# Patient Record
Sex: Male | Born: 1980 | Race: White | Hispanic: No | Marital: Married | Smoking: Former smoker
Health system: Southern US, Community
[De-identification: ages and names within clinical notes are randomized; demographics above are authoritative.]

## PROBLEM LIST (undated history)

## (undated) DIAGNOSIS — F909 Attention-deficit hyperactivity disorder, unspecified type: Secondary | ICD-10-CM

## (undated) DIAGNOSIS — F419 Anxiety disorder, unspecified: Secondary | ICD-10-CM

## (undated) DIAGNOSIS — K5190 Ulcerative colitis, unspecified, without complications: Secondary | ICD-10-CM

## (undated) DIAGNOSIS — F329 Major depressive disorder, single episode, unspecified: Secondary | ICD-10-CM

## (undated) DIAGNOSIS — F32A Depression, unspecified: Secondary | ICD-10-CM

## (undated) DIAGNOSIS — S0291XA Unspecified fracture of skull, initial encounter for closed fracture: Secondary | ICD-10-CM

## (undated) HISTORY — PX: BRAIN SURGERY: SHX531

---

## 2004-08-23 ENCOUNTER — Emergency Department (HOSPITAL_COMMUNITY): Admission: EM | Admit: 2004-08-23 | Discharge: 2004-08-23 | Payer: Self-pay | Admitting: Emergency Medicine

## 2014-12-19 ENCOUNTER — Emergency Department (HOSPITAL_BASED_OUTPATIENT_CLINIC_OR_DEPARTMENT_OTHER)
Admission: EM | Admit: 2014-12-19 | Discharge: 2014-12-19 | Disposition: A | Discharge status: Home / Self Care | Payer: BLUE CROSS/BLUE SHIELD | Attending: Emergency Medicine | Admitting: Emergency Medicine

## 2014-12-19 ENCOUNTER — Encounter (HOSPITAL_BASED_OUTPATIENT_CLINIC_OR_DEPARTMENT_OTHER): Payer: Self-pay

## 2014-12-19 DIAGNOSIS — Z720 Tobacco use: Secondary | ICD-10-CM | POA: Diagnosis not present

## 2014-12-19 DIAGNOSIS — F329 Major depressive disorder, single episode, unspecified: Secondary | ICD-10-CM | POA: Insufficient documentation

## 2014-12-19 DIAGNOSIS — F32A Depression, unspecified: Secondary | ICD-10-CM

## 2014-12-19 DIAGNOSIS — Z79899 Other long term (current) drug therapy: Secondary | ICD-10-CM | POA: Insufficient documentation

## 2014-12-19 DIAGNOSIS — F419 Anxiety disorder, unspecified: Secondary | ICD-10-CM | POA: Diagnosis present

## 2014-12-19 DIAGNOSIS — Z8781 Personal history of (healed) traumatic fracture: Secondary | ICD-10-CM | POA: Insufficient documentation

## 2014-12-19 DIAGNOSIS — F1310 Sedative, hypnotic or anxiolytic abuse, uncomplicated: Secondary | ICD-10-CM | POA: Diagnosis not present

## 2014-12-19 HISTORY — DX: Unspecified fracture of skull, initial encounter for closed fracture: S02.91XA

## 2014-12-19 HISTORY — DX: Anxiety disorder, unspecified: F41.9

## 2014-12-19 HISTORY — DX: Depression, unspecified: F32.A

## 2014-12-19 HISTORY — DX: Attention-deficit hyperactivity disorder, unspecified type: F90.9

## 2014-12-19 HISTORY — DX: Major depressive disorder, single episode, unspecified: F32.9

## 2014-12-19 LAB — URINALYSIS, ROUTINE W REFLEX MICROSCOPIC
Glucose, UA: NEGATIVE mg/dL
Hgb urine dipstick: NEGATIVE
KETONES UR: 15 mg/dL — AB
Leukocytes, UA: NEGATIVE
Nitrite: NEGATIVE
PH: 5.5 (ref 5.0–8.0)
PROTEIN: NEGATIVE mg/dL
Specific Gravity, Urine: 1.033 — ABNORMAL HIGH (ref 1.005–1.030)
Urobilinogen, UA: 0.2 mg/dL (ref 0.0–1.0)

## 2014-12-19 LAB — RAPID URINE DRUG SCREEN, HOSP PERFORMED
Amphetamines: NOT DETECTED
Barbiturates: NOT DETECTED
Benzodiazepines: POSITIVE — AB
Cocaine: NOT DETECTED
Opiates: NOT DETECTED
TETRAHYDROCANNABINOL: NOT DETECTED

## 2014-12-19 MED ORDER — ESCITALOPRAM OXALATE 10 MG PO TABS: 10.0000 mg | ORAL_TABLET | Freq: Every day | ORAL | Status: DC

## 2014-12-19 NOTE — Discharge Instructions (Signed)
Depression Depression refers to feeling sad, low, down in the dumps, blue, gloomy, or empty. In general, there are two kinds of depression: 1. Normal sadness or normal grief. This kind of depression is one that we all feel from time to time after upsetting life experiences, such as the loss of a job or the ending of a relationship. This kind of depression is considered normal, is short lived, and resolves within a few days to 2 weeks. Depression experienced after the loss of a loved one (bereavement) often lasts longer than 2 weeks but normally gets better with time. 2. Clinical depression. This kind of depression lasts longer than normal sadness or normal grief or interferes with your ability to function at home, at work, and in school. It also interferes with your personal relationships. It affects almost every aspect of your life. Clinical depression is an illness. Symptoms of depression can also be caused by conditions other than those mentioned above, such as:  Physical illness. Some physical illnesses, including underactive thyroid gland (hypothyroidism), severe anemia, specific types of cancer, diabetes, uncontrolled seizures, heart and lung problems, strokes, and chronic pain are commonly associated with symptoms of depression.  Side effects of some prescription medicine. In some people, certain types of medicine can cause symptoms of depression.  Substance abuse. Abuse of alcohol and illicit drugs can cause symptoms of depression. SYMPTOMS Symptoms of normal sadness and normal grief include the following:  Feeling sad or crying for short periods of time.  Not caring about anything (apathy).  Difficulty sleeping or sleeping too much.  No longer able to enjoy the things you used to enjoy.  Desire to be by oneself all the time (social isolation).  Lack of energy or motivation.  Difficulty concentrating or remembering.  Change in appetite or weight.  Restlessness or  agitation. Symptoms of clinical depression include the same symptoms of normal sadness or normal grief and also the following symptoms:  Feeling sad or crying all the time.  Feelings of guilt or worthlessness.  Feelings of hopelessness or helplessness.  Thoughts of suicide or the desire to harm yourself (suicidal ideation).  Loss of touch with reality (psychotic symptoms). Seeing or hearing things that are not real (hallucinations) or having false beliefs about your life or the people around you (delusions and paranoia). DIAGNOSIS  The diagnosis of clinical depression is usually based on how bad the symptoms are and how long they have lasted. Your health care provider will also ask you questions about your medical history and substance use to find out if physical illness, use of prescription medicine, or substance abuse is causing your depression. Your health care provider may also order blood tests. TREATMENT  Often, normal sadness and normal grief do not require treatment. However, sometimes antidepressant medicine is given for bereavement to ease the depressive symptoms until they resolve. The treatment for clinical depression depends on how bad the symptoms are but often includes antidepressant medicine, counseling with a mental health professional, or both. Your health care provider will help to determine what treatment is best for you. Depression caused by physical illness usually goes away with appropriate medical treatment of the illness. If prescription medicine is causing depression, talk with your health care provider about stopping the medicine, decreasing the dose, or changing to another medicine. Depression caused by the abuse of alcohol or illicit drugs goes away when you stop using these substances. Some adults need professional help in order to stop drinking or using drugs. [facility]  CARE IF:  You have thoughts about hurting yourself or others.  You lose touch  with reality (have psychotic symptoms).  You are taking medicine for depression and have a serious side effect. FOR MORE INFORMATION  National Alliance on Mental Illness: www.nami.[employer] of Mental Health: https://carter.com/ Document Released: 09/30/2000 Document Revised: 02/17/2014 Document Reviewed: 01/02/2012 [facility] Patient Information 2015 [place], [place]. This information is not intended to replace advice given to you by your health care provider. Make sure you discuss any questions you have with your health care provider. Emergency Department Resource Guide 1) Find a Doctor and Pay Out of Pocket Although you won't have to find out who is covered by your insurance plan, it is a good idea to ask around and get recommendations. You will then need to call the office and see if the doctor you have chosen will accept you as a new patient and what types of options they offer for patients who are self-pay. Some doctors offer discounts or will set up payment plans for their patients who do not have insurance, but you will need to ask so you aren't surprised when you get to your appointment.  2) Contact Your Local Health Department Not all health departments have doctors that can see patients for sick visits, but many do, so it is worth a call to see if yours does. If you don't know where your local health department is, you can check in your phone book. The CDC also has a tool to help you locate your state's health department, and many state websites also have listings of all of their local health departments.  3) Find a [facility] If your illness is not likely to be very severe or complicated, you may want to try a walk in clinic. These are popping up all over the country in pharmacies, drugstores, and shopping centers. They're usually staffed by nurse practitioners or physician assistants that have been trained to treat common illnesses and complaints. They're usually fairly  quick and inexpensive. However, if you have serious medical issues or chronic medical problems, these are probably not your best option.  No Primary Care Doctor: - Call Health Connect at  [phone] - they can help you locate a primary care doctor that  accepts your insurance, provides certain services, etc. - Physician Referral Service- [phone]  Chronic Pain Problems: Organization         Address     Phone             Notes  [facility]  [phone] Patients need to be referred by their primary care doctor.   Medication Assistance: Organization         Address     Phone             Notes  [facility] Medication [facility] [address] [place], [place] [address] [phone] --Must be a resident of [facility] -- Must have NO insurance coverage whatsoever (no Medicaid/ Medicare, etc.) -- The pt. MUST have a primary care doctor that directs their care regularly and follows them in the community   MedAssist  [phone]   [employer]  [phone]    Agencies that provide inexpensive medical care: Organization         Address     Phone             Notes  [place]  [phone]  [facility]    [phone]   [facility] [place], [place] [address] [phone]   [place]. [address], [place] [phone]   Planned Parenthood    [phone]   [facility]    [phone]   [place] and [place] [address], [place] Phone:  [phone], Fax:  [phone] Hours of Operation:  9 am - 6 pm, M-F.  Also accepts Medicaid/Medicare and self-pay.  [facility] for [place], [address], [place] Phone: [phone], Fax: [phone]. Hours of Operation:  8:30 am - 5:30 pm, M-F.  Also accepts Medicaid and self-pay.  [facility] [place] [address] [place] Phone: [phone]   [place], [place], [place] [phone], Ext. [phone] Mondays & Thursdays: 7-9 AM.  First 15 patients are seen on a first come, first serve basis.   [facility] [place] [address], [place] [address] [phone] Accepts Medicaid   [place] Providers:  Organization         Address     Phone             Notes  [facility] [address], [place] [phone] Also accepts self-pay patients.  [facility] [address], [place]  [phone]   [place], [address], [place] [phone]   [facility] [address], [place] [phone]   Lucianne Lei [address], [place]   [phone] Only accepts [place] Access [place] patients after they have their name applied to their card.   Self-Pay (no insurance) in [facility]:  Organization         Address     Phone             Notes  Sickle Cell Patients, [facility] [place] [phone]   [facility] [place] [phone]   [facility] [place]  [place], [place], [place] [phone]   [facility]/Dr. Osei-Bonsu  [address] [place] or [address], [place] [phone] Phone number for both [place] and [place] locations is the same.  Urgent Medical and [facility] [address] [place] [phone]   [facility] [address] [place] or [address] [phone]   [facility] [address], [place] [phone], phone; [phone], fax Sees patients 1st and 3rd Saturday of every month.  Must not qualify for public or private  insurance (i.e. Medicaid, Medicare, [place] Health Choice, Veterans' Benefits)  Household income should be no more than 200% of the poverty level The clinic cannot treat you if you are pregnant or think you are pregnant  Sexually transmitted diseases are not treated at the clinic.    Dental Care:  Organization         Address     Phone             Notes  [facility] [address] [place], [place] (  [phone] Accepts children up to age 16 who are enrolled in Medicaid or [place]; pregnant women with a Medicaid card; and children who have applied for Medicaid or [place] Health Choice, but were declined, whose parents can pay a reduced fee at time of service.  [facility] Department of [facility]  [address], [place] [phone] Accepts children up to age 73 who are enrolled in [place] or [place]; pregnant women with a Medicaid card; and children who have applied for Medicaid or [place] Health Choice, but were declined, whose parents can pay a reduced fee at time of service.  [facility] Adult [facility] Access PROGRAM  [place] [phone] Patients are seen by appointment only. Walk-ins are not accepted. Lane will see patients 27 years of age and older. Monday - Tuesday (8am-5pm) Most Wednesdays (8:30-5pm) $30 per visit, cash only  [facility] Adult Dental Access PROGRAM  [address], [facility] [phone] Patients are seen by appointment only. Walk-ins are not accepted. [place] will see patients 27 years of age and older. One Wednesday Evening (Monthly: Volunteer Based).  $30 per visit, cash only  [place]  [phone] for adults; Children under age 72, call Graduate Pediatric Dentistry at [phone]. Children aged 78-14, please call [phone] to request a pediatric application.  Dental services are provided in all areas of  dental care including fillings, crowns and bridges, complete and partial dentures, implants, gum treatment, root canals, and extractions. Preventive care is also provided. Treatment is provided to both adults and children. Patients are selected via a lottery and there is often a waiting list.   [facility] [address] [place]  [phone] [email or web address]   [facility] [address] [place], [place] [phone], Ext. [phone] Second and Fourth Thursday of each month, opens at 6:30 AM; Clinic ends at 9 AM.  Patients are seen on a first-come first-served basis, and a limited number are seen during each clinic.   [facility]  [address] Hillard Danker [place], [place] [phone]   Eligibility Requirements You must have lived in [place], [place], or [place] counties for at least the last three months.   You cannot be eligible for state or federal sponsored [employer], including [employer], [place], or [employer].   You generally cannot be eligible for healthcare insurance through your employer.    How to apply: Eligibility screenings are held every Tuesday and Wednesday afternoon from 1:00 pm until 4:00 pm. You do not need an appointment for the interview!  [facility] [address] [place], [place]   [place]  [place] Department  [place]  [phone]    Behavioral Health Resources in the Community: Intensive Outpatient Programs Organization         Address     Phone             Notes  [place] [facility]. [address] [place], [place] [phone]   [facility] [address], [place], [place]   ADS: Alcohol & Drug Svcs [address], [place], [place]   [place] [address],  [place], [place] or [phone]     Substance Abuse Resources Organization  Address     Phone             Notes  Alcohol and Drug Services  [phone]   [place]  [phone]   [facility] [place]  [phone]   Chinita Pester  [phone]   Residential & Outpatient Substance Abuse Program  [phone]   Psychological Services Organization         Address     Phone             Notes  [facility] [place]  [place]  [phone]   [place] [address] [place] or [phone]    Mobile Crisis Teams Organization         Address     Phone             Notes  [facility], Mobile Crisis Care Unit  [phone]   [facility]ive Psychotherapeutic Services  [address] [place], [place]   [place] [address] [place] [phone]    Self-Help/Support Groups Organization         Address     Phone             Notes  [place]. of [place] - variety of support groups  [place] Call for more information  Narcotics Anonymous (NA), [facility]aring [facility] [address], [employer] [place]  2 meetings at this location   Special educational needs teacher         Address     Phone             Notes  [facility]AP Residential Treatment [place],    [place]  [phone]   [facility]  [address], [place] [address], [place], [place]   [place], [place] [phone] Admissions: 8am-3pm M-F  Incentives Substance [place] [address],    [place], [place] [phone]   [facility] [address] [place], [place], [place]   [facility] [address],  [place], [place]   Insight Programs - Intensive Outpatient [address] Kristeen [address], [place], [place]   [facility] ([place].) [place].,  [place], [place] [phone] or [phone]   [facility]idential Treatment Services (RTS) [address], [place], [place] Accepts Medicaid  [facility] [place] [address],  [place] [phone] Substance Abuse/Addiction Treatment   [facility] Organization         Address     Phone             Notes  [facility]  [phone]   Domenic Schwab, PhD [address] Arlis [place], [place]   [phone] or [phone]   [place], [place] [phone]   [place] [address] [place], [place] [phone] Insurance/Medicaid/sponsorship through [facility] and Families [address], [address] [place]                                    [place], [place] [phone] [place] [address] [place], [place] [phone]    Dr. Adele Schilder  [phone]   [facility] [place]  [facility] Dept. 1) [address], [place] 2) [place] 3)  [place] [address], [place] [phone]  [phone]   [place] [phone] or [phone] (After Hours)

## 2014-12-19 NOTE — ED Notes (Addendum)
Mother with pt, talking over pt intermittently.  States pt has been under a lot of stress recently and had an episode at work recently in which he had st memory loss, seen by osh in [place] had both medical and psych eval and sent home.  Mother upset over this.  Pt states he feels fine now other than anxiety and depression.  States he works a lot, home life is "in a wreck", has had insomnia.  Denies si/hi. Denied pain initially but when asked pain score pt states he is having "chest pressure".  Pt reports has been to several people for anxiety and depression and has not been able to get treated so has "given up on trying to get help".  Denies any abuse, states he is just "really stressed and it makes daily life function impossible".

## 2014-12-19 NOTE — ED Provider Notes (Signed)
CSN: [id number]     Arrival date & time 12/19/14  1213 History   First MD Initiated Contact with Patient 12/19/14 1427     Chief Complaint  Patient presents with  . Anxiety    HPI Patient presents to the emergency room for help with depression. Patient does have history of depression. She is healthy symptoms off and on throughout his life. Usually he is able to manage it.  Recently he has been having more issues with stress at work. His long-time girlfriend also left him. That work in [place] when he had an episode of global amnesia. According to the patient's mother and the patient was evaluated at [facility]. They eventually transferred him to a psychiatric facility. The mother states the patient was released.  He was not given any medications to take however. His mother gave him one of his Xanax which helped a little bit.  Patient's mother drove the patient back to [place]. She is trying to get him to see his psychiatrist in [place]. Patient denies any acute issues with suicidal or homicidal ideation. He would like to get started on some type of medication so he can get back to work. Past Medical History  Diagnosis Date  . Anxiety   . Depression   . ADHD (attention deficit hyperactivity disorder)   . Depressed skull fracture    Past Surgical History  Procedure Laterality Date  . Brain surgery     No family history on file. History  Substance Use Topics  . Smoking status: Current Every Day Smoker -- 1.00 packs/day    Types: Cigarettes  . Smokeless tobacco: Not on file  . Alcohol Use: No    Review of Systems  All other systems reviewed and are negative.     Allergies  Review of patient's allergies indicates no known allergies.  Home Medications   Prior to Admission medications   Medication Sig Start Date End Date Taking? Authorizing Provider  escitalopram (LEXAPRO) 10 MG tablet Take 1 tablet (10 mg total) by mouth daily. 12/19/14   Dorie Rank, MD   BP  125/86 mmHg  Pulse 111  Temp(Src) 98.6 F (37 C) (Oral)  Resp 18  Ht 5' 10"  (1.778 m)  Wt 155 lb (70.308 kg)  BMI 22.24 kg/m2  SpO2 99% Physical Exam  Constitutional: He appears well-developed and well-nourished. No distress.  HENT:  Head: Normocephalic and atraumatic.  Right Ear: External ear normal.  Left Ear: External ear normal.  Eyes: Conjunctivae are normal. Right eye exhibits no discharge. Left eye exhibits no discharge. No scleral icterus.  Neck: Neck supple. No tracheal deviation present.  Cardiovascular: Normal rate.   Pulmonary/Chest: Effort normal. No stridor. No respiratory distress.  Musculoskeletal: He exhibits no edema.  Neurological: He is alert. Cranial nerve deficit: no gross deficits.  Skin: Skin is warm and dry. No rash noted.  Psychiatric: His speech is not delayed, not tangential and not slurred. He is not slowed and not withdrawn. Cognition and memory are not impaired. He exhibits a depressed mood. He expresses no homicidal and no suicidal ideation. He is communicative.  Nursing note and vitals reviewed.   ED Course  Procedures (including critical care time) Labs Review Labs Reviewed  URINALYSIS, ROUTINE W REFLEX MICROSCOPIC - Abnormal; Notable for the following:    Color, Urine AMBER (*)    Specific Gravity, Urine 1.033 (*)    Bilirubin Urine SMALL (*)    Ketones, ur 15 (*)    All other components  within normal limits  URINE RAPID DRUG SCREEN (HOSP PERFORMED) - Abnormal; Notable for the following:    Benzodiazepines POSITIVE (*)    All other components within normal limits    Imaging Review No results found.   EKG Interpretation   Date/Time:  Friday December 19 2014 12:32:48 EST Ventricular Rate:  92 PR Interval:  128 QRS Duration: 94 QT Interval:  356 QTC Calculation: 440 R Axis:   93 Text Interpretation:  Normal sinus rhythm with sinus arrhythmia Rightward  axis Incomplete right bundle branch block Borderline ECG No old tracing to   compare Confirmed by Kamden Reber  MD-J, Neyla Gauntt ([id number]) on 12/19/2014 12:51:29 PM      MDM   Final diagnoses:  Depression    The patient's symptoms are concerning for depression. I think it is reasonable to start him on a low dose of Lexapro. Mother requested benzodiazepines. I discussed my concerns with this type of medication and I will give him a referral to a psychiatrist as well.    Dorie Rank, MD 12/19/14 [phone]

## 2017-08-09 ENCOUNTER — Encounter: Payer: Self-pay | Admitting: *Deleted

## 2017-08-09 ENCOUNTER — Emergency Department
Admission: EM | Admit: 2017-08-09 | Discharge: 2017-08-09 | Disposition: A | Discharge status: Home / Self Care | Payer: Self-pay | Source: Home / Self Care | Attending: Family Medicine | Admitting: Family Medicine

## 2017-08-09 ENCOUNTER — Emergency Department (INDEPENDENT_AMBULATORY_CARE_PROVIDER_SITE_OTHER): Payer: Self-pay

## 2017-08-09 DIAGNOSIS — S63613A Unspecified sprain of left middle finger, initial encounter: Secondary | ICD-10-CM

## 2017-08-09 DIAGNOSIS — W3189XA Contact with other specified machinery, initial encounter: Secondary | ICD-10-CM

## 2017-08-09 DIAGNOSIS — S61213A Laceration without foreign body of left middle finger without damage to nail, initial encounter: Secondary | ICD-10-CM

## 2017-08-09 NOTE — Discharge Instructions (Signed)
°  You should keep today's bandage in place for at least 24-48 hours unless it gets very dirty or wet.  Gentle remove bandage. Clean wound with warm water and mild soap. Do NOT soak your finger.  Pat dry wound.  You may apply antibiotic ointment such as Neosporin or Polysporin 2-3 times daily with dressing changes.  It is recommended that you use the finger splint or apply a bulky bandage to keep your joint from bending for at least 5-7 days, longer if concerned sutures will pull out when using your hands.    Keep wound covered to protect from further trauma or debris.   You may have acetaminophen and ibuprofen as needed for pain and swelling.

## 2017-08-09 NOTE — ED Triage Notes (Signed)
Pt c/o LT 3rd finger laceration x 1700 today after "a grinder blew up in my hand". Reports Tdap 2017.

## 2017-08-09 NOTE — ED Provider Notes (Signed)
[name]    CSN: [id number] Arrival date & time: 08/09/17  1914     History   Chief Complaint Chief Complaint  Patient presents with  . Laceration    HPI Kevin Carroll is a 36 y.o. male.   HPI Kevin Carroll is a 36 y.o. male presenting to UC with wife with c/o Left middle finger pain, swelling, decreased ROM and laceration that occurred around 1700 today after "a grinder blew up in my hand." Pt states the piece of machinery had about 800-1000PRMs. Bleeding controlled PTA with pressure bandage.  Pain is aching and sore, 2/10.  No pain medication taken PTA.  Pt is Right hand dominant. Last tetanus in 2017.  No other injuries.    Past Medical History:  Diagnosis Date  . ADHD (attention deficit hyperactivity disorder)   . Anxiety   . 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 Depression     There are no active problems to display for this patient.   Past Surgical History:  Procedure Laterality Date  . BRAIN SURGERY         Home Medications    Prior to Admission medications   Medication Sig Start Date End Date Taking? Authorizing Provider  lisdexamfetamine (VYVANSE) 40 MG capsule Take 40 mg by mouth every morning.   Yes [provider]    Family History Family History  Problem Relation Age of Onset  . Leukemia Mother     Social History Social History  Substance Use Topics  . Smoking status: Former Smoker    Packs/day: 1.00    Types: Cigarettes    Quit date: 01/07/2017  . Smokeless tobacco: Never Used  . Alcohol use No     Allergies   Patient has no known allergies.   Review of Systems Review of Systems  Musculoskeletal: Positive for arthralgias and joint swelling. Negative for myalgias.  Skin: Positive for wound. Negative for color change.  Neurological: Positive for weakness ( Left middle finger due to pain and swelling.). Negative for numbness.     Physical Exam Triage Vital Signs ED Triage Vitals  Enc Vitals Group    BP 08/09/17 1933 131/88     Pulse Rate 08/09/17 1933 83     Resp 08/09/17 1933 16     Temp --      Temp src --      SpO2 08/09/17 1933 100 %     Weight 08/09/17 1933 170 lb (77.1 kg)     Height 08/09/17 1933 5' 9"  (1.753 m)     Head Circumference --      Peak Flow --      Pain Score 08/09/17 1934 2     Pain Loc --      Pain Edu? --      Excl. in [place]? --    No data found.   Updated Vital Signs BP 131/88 (BP Location: Left Arm)   Pulse 83   Resp 16   Ht 5' 9"  (1.753 m)   Wt 170 lb (77.1 kg)   SpO2 100%   BMI 25.10 kg/m   Visual Acuity Right Eye Distance:   Left Eye Distance:   Bilateral Distance:    Right Eye Near:   Left Eye Near:    Bilateral Near:     Physical Exam  Constitutional: He is oriented to person, place, and time. He appears well-developed and well-nourished. No distress.  HENT:  Head: Normocephalic and atraumatic.  Eyes: EOM are  normal.  Neck: Normal range of motion.  Cardiovascular: Normal rate.   Pulmonary/Chest: Effort normal.  Musculoskeletal: He exhibits edema and tenderness.  Left middle finger: mild edema around PIP joint, tender. Unable to flex due to pain. Mild tenderness at DIP joint w/o edema. Limited flexion.   Neurological: He is alert and oriented to person, place, and time.  Left hand: normal sensation in all fingers.   Skin: Skin is warm and dry. Capillary refill takes less than 2 seconds. He is not diaphoretic.  Left middle finger: 1.5cm laceration over dorsal aspect PIP joint. Bleeding controlled. No foreign bodies seen or palpated.   Psychiatric: He has a normal mood and affect. His behavior is normal.  Nursing note and vitals reviewed.    UC Treatments / Results  Labs (all labs ordered are listed, but only abnormal results are displayed) Labs Reviewed - No data to display  EKG  EKG Interpretation None       Radiology Dg Hand Complete Left  Result Date: 08/09/2017 CLINICAL DATA:  Laceration to left middle finger.  EXAM: LEFT HAND - COMPLETE 3+ VIEW COMPARISON:  None. FINDINGS: There is no evidence of fracture or dislocation. There is no evidence of arthropathy or other focal bone abnormality. Soft tissues are unremarkable. No radiopaque foreign body. IMPRESSION: No fracture or radiopaque foreign body. Electronically Signed   By: Rolm Baptise M.D.   On: 08/09/2017 19:46    Procedures .Marland KitchenLaceration Repair Date/Time: 08/09/2017 8:10 PM Performed by: Noe Gens Authorized by: Theone Murdoch A   Consent:    Consent obtained:  Verbal   Consent given by:  Patient   Risks discussed:  Infection, pain, poor cosmetic result and poor wound healing   Alternatives discussed:  Delayed treatment Anesthesia (see MAR for exact dosages):    Anesthesia method:  Nerve block   Block needle gauge:  25 G   Block anesthetic:  Lidocaine 2% w/o epi   Block technique:  Digital   Block injection procedure:  Anatomic landmarks identified, anatomic landmarks palpated, negative aspiration for blood, introduced needle and incremental injection   Block outcome:  Anesthesia achieved Laceration details:    Location:  Finger   Finger location:  L long finger   Length (cm):  1.5   Depth (mm):  3 Repair type:    Repair type:  Simple Pre-procedure details:    Preparation:  Patient was prepped and draped in usual sterile fashion and imaging obtained to evaluate for foreign bodies Exploration:    Hemostasis achieved with:  Direct pressure   Wound exploration: wound explored through full range of motion and entire depth of wound probed and visualized     Wound extent: no areolar tissue violation noted, no fascia violation noted, no foreign bodies/material noted, no muscle damage noted, no nerve damage noted, no tendon damage noted, no underlying fracture noted and no vascular damage noted     Contaminated: no   Treatment:    Area cleansed with:  Hibiclens and saline   Amount of cleaning:  Standard   Irrigation solution:   Sterile saline   Irrigation volume:  40   Irrigation method:  Syringe Skin repair:    Repair method:  Sutures   Suture size:  4-0   Wound skin closure material used: Ethylon.   Suture technique:  Simple interrupted   Number of sutures:  3 Approximation:    Approximation:  Close   Vermilion border: well-aligned   Post-procedure details:    Dressing:  Antibiotic ointment, non-adherent dressing, splint for protection and bulky dressing   Patient tolerance of procedure:  Tolerated well, no immediate complications   (including critical care time)  Medications Ordered in UC Medications - No data to display   Initial Impression / Assessment and Plan / UC Course  I have reviewed the triage vital signs and the nursing notes.  Pertinent labs & imaging results that were available during my care of the patient were reviewed by me and considered in my medical decision making (see chart for details).     Laceration to Left middle finger w/o fracture or dislocation. Sutures applied as noted above.  Finger splint and bandage applied. Home care instructions provided F/u in 10-14 days for suture removal. Sooner if concern for poor wound healing or infection.   Final Clinical Impressions(s) / UC Diagnoses   Final diagnoses:  Laceration of left middle finger without foreign body without damage to nail, initial encounter  Sprain of left middle finger, initial encounter    New Prescriptions New Prescriptions   No medications on file     Controlled Substance Prescriptions  Controlled Substance Registry consulted? Not Applicable   Tyrell Antonio 08/09/17 2012

## 2019-03-05 ENCOUNTER — Other Ambulatory Visit: Payer: Self-pay

## 2019-03-05 ENCOUNTER — Emergency Department (INDEPENDENT_AMBULATORY_CARE_PROVIDER_SITE_OTHER)
Admission: EM | Admit: 2019-03-05 | Discharge: 2019-03-05 | Disposition: A | Discharge status: Home / Self Care | Payer: BLUE CROSS/BLUE SHIELD | Source: Home / Self Care

## 2019-03-05 DIAGNOSIS — S91332A Puncture wound without foreign body, left foot, initial encounter: Secondary | ICD-10-CM

## 2019-03-05 MED ORDER — IBUPROFEN 600 MG PO TABS: 600.0000 mg | ORAL_TABLET | Freq: Four times a day (QID) | ORAL | 0 refills | Status: DC | PRN

## 2019-03-05 MED ORDER — AMOXICILLIN-POT CLAVULANATE 875-125 MG PO TABS: 1.0000 | ORAL_TABLET | Freq: Two times a day (BID) | ORAL | 0 refills | Status: DC

## 2019-03-05 NOTE — ED Provider Notes (Signed)
[facility]    CSN: [id number] Arrival date & time: 03/05/19  8676     History   Chief Complaint Chief Complaint  Patient presents with  . Foot Injury    HPI Kevin Carroll is a 38 y.o. male.   HPI  Kevin Carroll is a 38 y.o. male presenting to UC with c/o Left foot pain after stepping on a metal nail that went through his shoe, into his foot yesterday morning. He notes the entire nail came back out of his foot, he does not believe any part of the nail broke off in his foot.  Pain is aching and sore, worse at the end of the day yesterday after he had stood for hours on his feet.  He notes his wife is a Marine scientist and cleaned the wound with a special soap yesterday but encouraged him to come in today for an antibiotic.  Denies fever, chills. Denies drainage from the wound. Last tetanus was 2 years ago.    Past Medical History:  Diagnosis Date  . ADHD (attention deficit hyperactivity disorder)   . Anxiety   . 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 Depression     There are no active problems to display for this patient.   Past Surgical History:  Procedure Laterality Date  . BRAIN SURGERY         Home Medications    Prior to Admission medications   Medication Sig Start Date End Date Taking? Authorizing Provider  Golimumab (SIMPONI) 100 MG/ML SOAJ Inject into the skin. 12/03/18  Yes [provider]  amoxicillin-clavulanate (AUGMENTIN) 875-125 MG tablet Take 1 tablet by mouth 2 (two) times daily. One po bid x 7 days 03/05/19   Noe Gens, PA-C  ibuprofen (ADVIL) 600 MG tablet Take 1 tablet (600 mg total) by mouth every 6 (six) hours as needed. 03/05/19   Noe Gens, PA-C  lisdexamfetamine (VYVANSE) 40 MG capsule Take 40 mg by mouth every morning.    [provider]    Family History Family History  Problem Relation Age of Onset  . Leukemia Mother     Social History Social History   Tobacco Use  . Smoking status: Former Smoker   Packs/day: 1.00    Types: Cigarettes    Last attempt to quit: 01/07/2017    Years since quitting: 2.1  . Smokeless tobacco: Never Used  Substance Use Topics  . Alcohol use: No  . Drug use: No     Allergies   Patient has no known allergies.   Review of Systems Review of Systems  Constitutional: Negative for chills and fever.  Musculoskeletal: Positive for arthralgias and joint swelling. Negative for myalgias.  Skin: Positive for color change and wound. Negative for rash.  Neurological: Negative for weakness and numbness.     Physical Exam Triage Vital Signs ED Triage Vitals  Enc Vitals Group     BP 03/05/19 0856 124/80     Pulse Rate 03/05/19 0856 84     Resp 03/05/19 0856 18     Temp 03/05/19 0856 (!) 97 F (36.1 C)     Temp Source 03/05/19 0856 Tympanic     SpO2 03/05/19 0856 97 %     Weight 03/05/19 0857 180 lb (81.6 kg)     Height 03/05/19 0857 5' 10"  (1.778 m)     Head Circumference --      Peak Flow --      Pain Score 03/05/19 0857  6     Pain Loc --      Pain Edu? --      Excl. in [place]? --    No data found.  Updated Vital Signs BP 124/80 (BP Location: Right Arm)   Pulse 84   Temp (!) 97 F (36.1 C) (Tympanic)   Resp 18   Ht 5' 10"  (1.778 m)   Wt 180 lb (81.6 kg)   SpO2 97%   BMI 25.83 kg/m   Visual Acuity Right Eye Distance:   Left Eye Distance:   Bilateral Distance:    Right Eye Near:   Left Eye Near:    Bilateral Near:     Physical Exam Vitals signs and nursing note reviewed.  Constitutional:      Appearance: He is well-developed.  HENT:     Head: Normocephalic and atraumatic.  Neck:     Musculoskeletal: Normal range of motion.  Cardiovascular:     Rate and Rhythm: Normal rate.  Pulmonary:     Effort: Pulmonary effort is normal.  Musculoskeletal:        General: Swelling and tenderness present.     Comments: Left foot: mild edema to 1st metatarsal on plantar aspect, tender. Decreased flexion of great toe due to pain.   Skin:     General: Skin is warm and dry.     Capillary Refill: Capillary refill takes less than 2 seconds.     Findings: Erythema present.     Comments: Left foot: puncture wound to plantar aspect of distal 1st metatarsal. Mild erythema. No drainage or bleeding. No foreign bodies seen or palpated.   Neurological:     General: No focal deficit present.     Mental Status: He is alert and oriented to person, place, and time.  Psychiatric:        Behavior: Behavior normal.      UC Treatments / Results  Labs (all labs ordered are listed, but only abnormal results are displayed) Labs Reviewed - No data to display  EKG None  Radiology No results found.  Procedures Procedures (including critical care time)  Medications Ordered in UC Medications - No data to display  Initial Impression / Assessment and Plan / UC Course  I have reviewed the triage vital signs and the nursing notes.  Pertinent labs & imaging results that were available during my care of the patient were reviewed by me and considered in my medical decision making (see chart for details).    Tetanus UTD. Discussed imaging due to limited movement of great toe, pt declined at this time. He would like to start the antibiotics and give it a few more days. Pt agreeable to return if symptoms not improving with home care. Wound soaked in warm water and hibiclens in UC, wound bandaged. AVS provided.  Final Clinical Impressions(s) / UC Diagnoses   Final diagnoses:  Puncture wound of left foot, initial encounter     Discharge Instructions      Keep wound clean with warm water and soap. You should keep wound covered with a bandage until it is healed, replacing bandage 2-3 times a day, more frequent if wet or dirty.    Please take antibiotics as prescribed and be sure to complete entire course to help prevent infection. If you notice worsening pain, redness, swelling, fever, please return to urgent care or call to schedule an  appointment with sports medicine as an x-ray may be needed at that time and possibly change in antibiotics.  ED Prescriptions    Medication Sig Dispense Auth. Provider   amoxicillin-clavulanate (AUGMENTIN) 875-125 MG tablet Take 1 tablet by mouth 2 (two) times daily. One po bid x 7 days 14 tablet Kaitlan Bin O, PA-C   ibuprofen (ADVIL) 600 MG tablet Take 1 tablet (600 mg total) by mouth every 6 (six) hours as needed. 30 tablet Noe Gens, PA-C     Controlled Substance Prescriptions [place] Controlled Substance Registry consulted? Not Applicable   Tyrell Antonio 03/05/19 6002

## 2019-03-05 NOTE — ED Triage Notes (Signed)
Pt stepped on a nail yesterday am at work.  Puncture wound on pad just below big toe of left foot.  Pt states that tetanus is up to date.

## 2019-03-05 NOTE — Discharge Instructions (Signed)
°  Keep wound clean with warm water and soap. You should keep wound covered with a bandage until it is healed, replacing bandage 2-3 times a day, more frequent if wet or dirty.    Please take antibiotics as prescribed and be sure to complete entire course to help prevent infection. If you notice worsening pain, redness, swelling, fever, please return to urgent care or call to schedule an appointment with sports medicine as an x-ray may be needed at that time and possibly change in antibiotics.

## 2019-04-12 IMAGING — DX DG HAND COMPLETE 3+V*L*
3 series · 3 of 3 positions shown · non-contrast
Comparison: None.

CLINICAL DATA: Laceration to left middle finger.

EXAM:
LEFT HAND - COMPLETE 3+ VIEW

[hand pa]
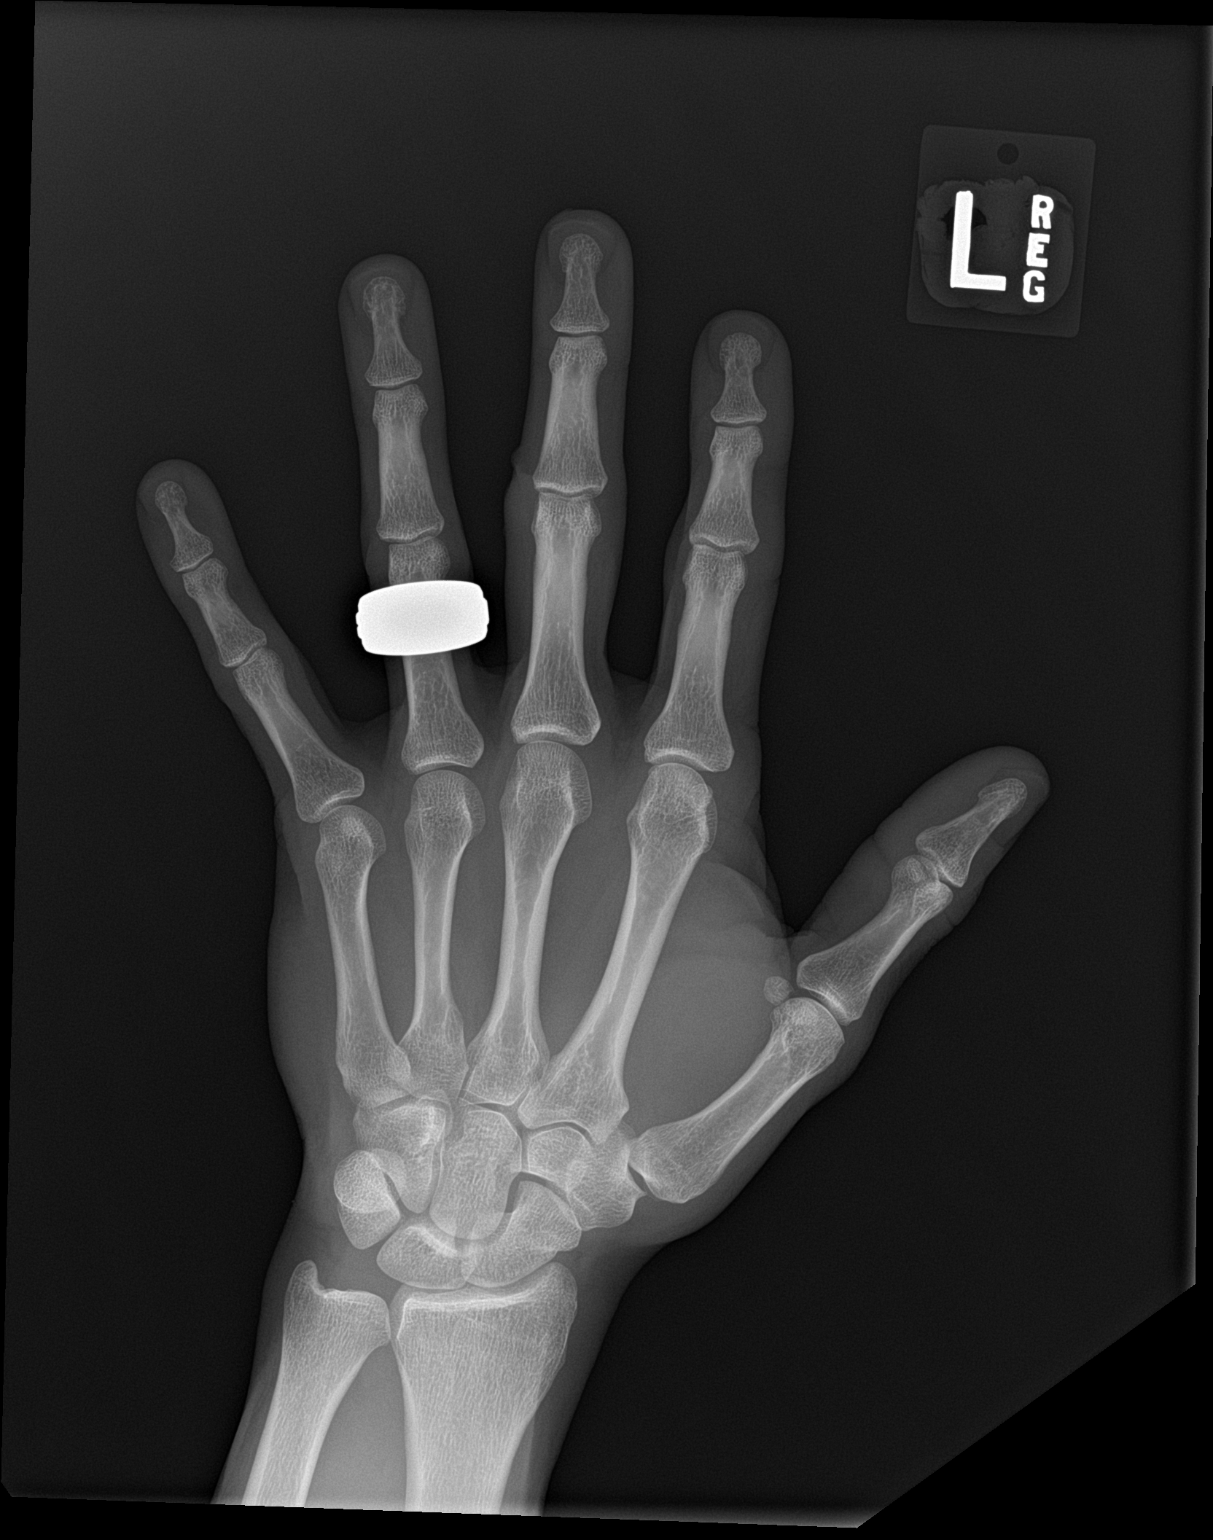

[hand obl]
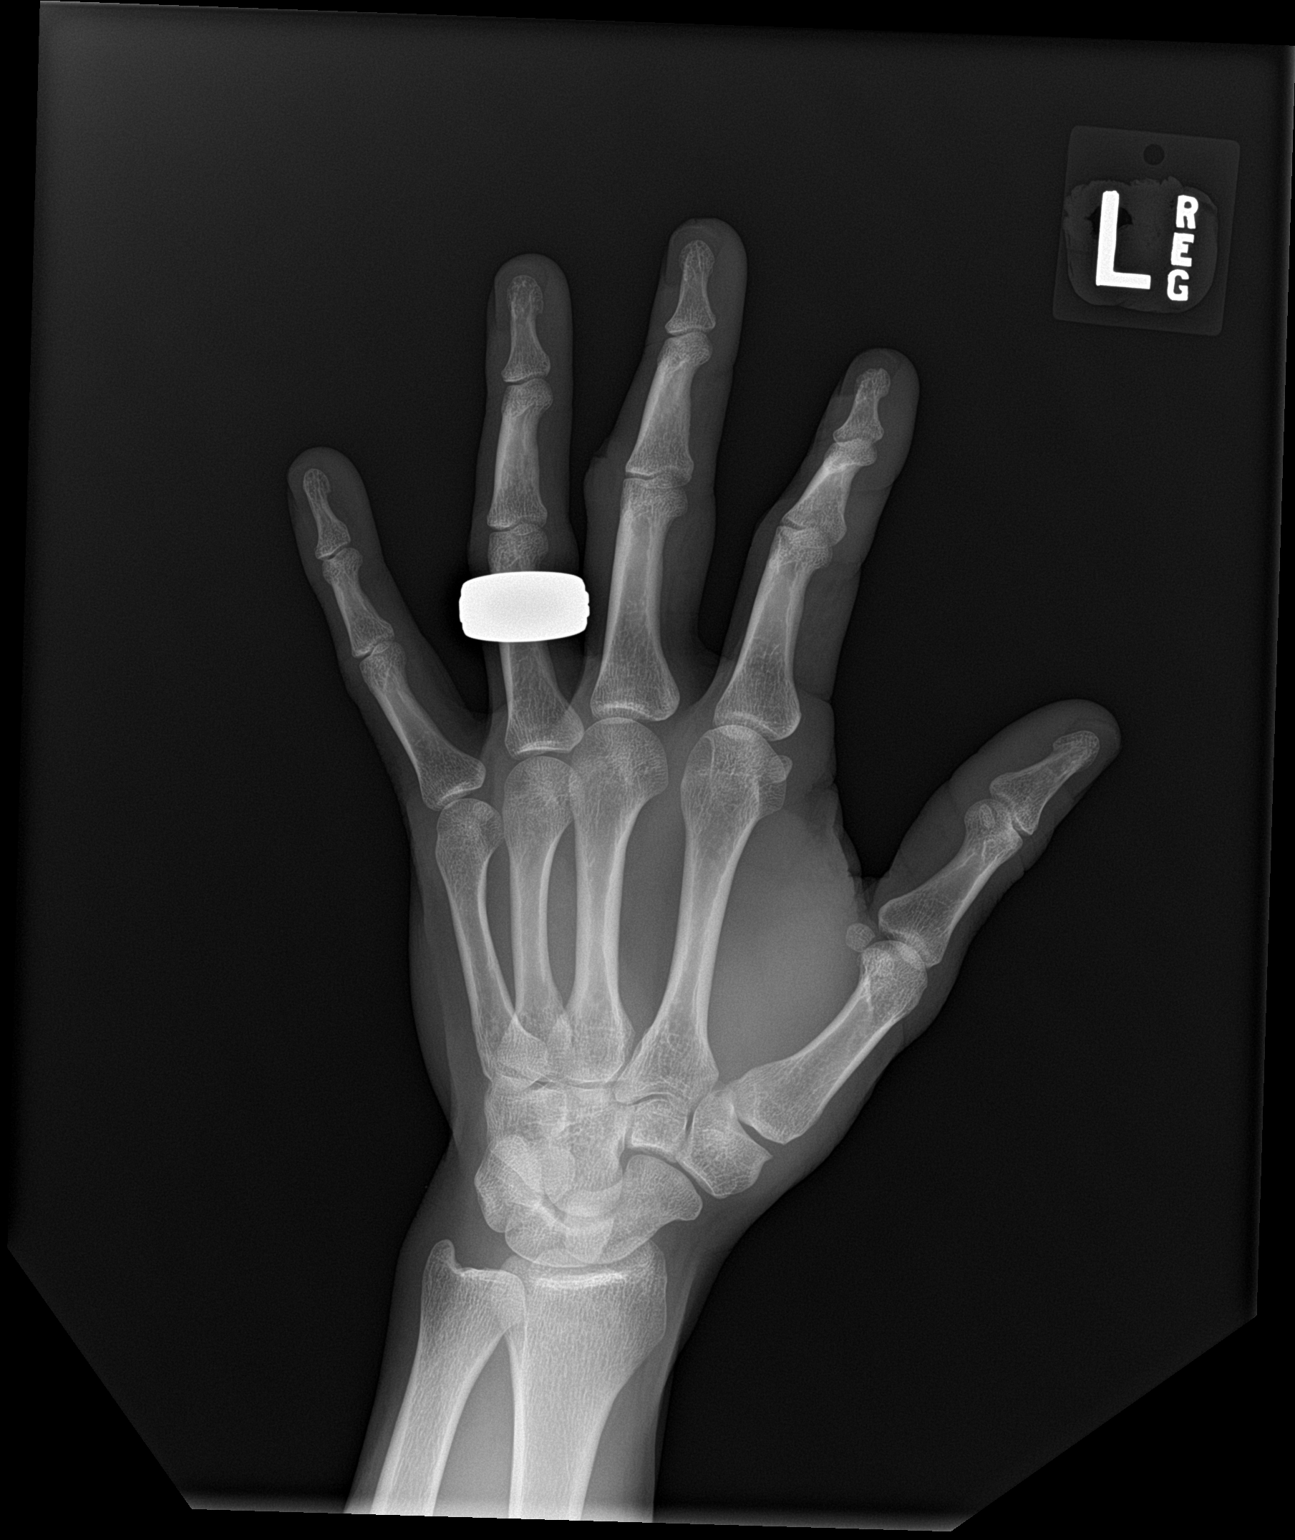

[hand lat]
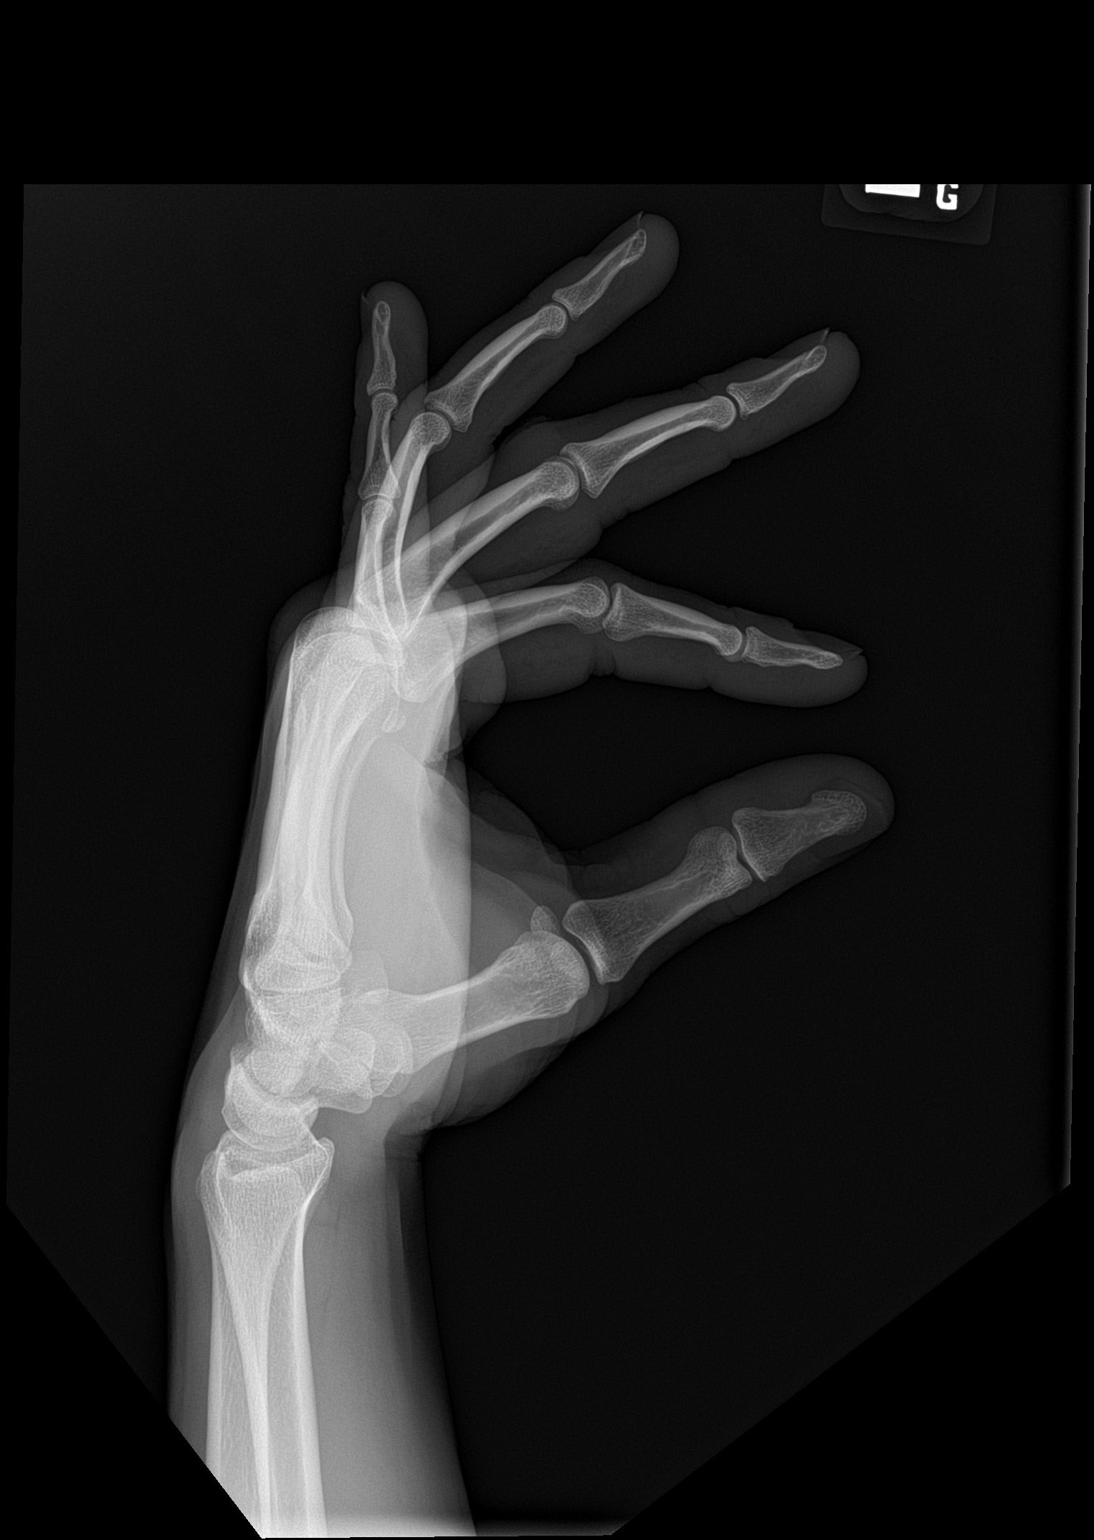

[3 of 3 positions shown; findings below may reference images not displayed]

FINDINGS: There is no evidence of fracture or dislocation. There is no
evidence of arthropathy or other focal bone abnormality. Soft
tissues are unremarkable. No radiopaque foreign body.
IMPRESSION: No fracture or radiopaque foreign body.

## 2019-08-22 ENCOUNTER — Emergency Department (INDEPENDENT_AMBULATORY_CARE_PROVIDER_SITE_OTHER)
Admission: EM | Admit: 2019-08-22 | Discharge: 2019-08-22 | Disposition: A | Discharge status: Home / Self Care | Payer: BC Managed Care – PPO | Source: Home / Self Care

## 2019-08-22 ENCOUNTER — Other Ambulatory Visit: Payer: Self-pay

## 2019-08-22 ENCOUNTER — Encounter: Payer: Self-pay | Admitting: *Deleted

## 2019-08-22 DIAGNOSIS — K047 Periapical abscess without sinus: Secondary | ICD-10-CM

## 2019-08-22 DIAGNOSIS — K029 Dental caries, unspecified: Secondary | ICD-10-CM

## 2019-08-22 HISTORY — DX: Ulcerative colitis, unspecified, without complications: K51.90

## 2019-08-22 MED ORDER — CLINDAMYCIN HCL 300 MG PO CAPS: 300.0000 mg | ORAL_CAPSULE | Freq: Three times a day (TID) | ORAL | 0 refills | Status: AC

## 2019-08-22 NOTE — Discharge Instructions (Signed)
°  Please take antibiotics as prescribed and be sure to complete entire course even if you start to feel better to ensure infection does not come back.

## 2019-08-22 NOTE — ED Provider Notes (Signed)
[name]    CSN: [id number] Arrival date & time: 08/22/19  1820      History   Chief Complaint Chief Complaint  Patient presents with  . Dental Problem    HPI Kevin Carroll is a 38 y.o. male.   HPI  Kevin Carroll is a 38 y.o. male presenting to UC with c/o 2-3 days of worsening Right upper tooth and gum pain with swelling and drainage of pus.  He had a dental procedure on the same tooth a few weeks ago.  He believes the area has become infected again. He was on amoxicillin prior to the procedure but not after.  His wife scheduled him for another appointment but he is not sure when it is. He believes about 1-2 weeks from today as that is how long it took for him to get his initial dental visit but he hopes due to recent procedure, they can fit him in sooner. Denies fever, chills, n/v/d.   Past Medical History:  Diagnosis Date  . ADHD (attention deficit hyperactivity disorder)   . Anxiety   . 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 Depression   . UC (ulcerative colitis) ([place])     There are no active problems to display for this patient.   Past Surgical History:  Procedure Laterality Date  . BRAIN SURGERY         Home Medications    Prior to Admission medications   Medication Sig Start Date End Date Taking? Authorizing Provider  clindamycin (CLEOCIN) 300 MG capsule Take 1 capsule (300 mg total) by mouth 3 (three) times daily for 7 days. 08/22/19 08/29/19  Noe Gens, PA-C  Golimumab (SIMPONI) 100 MG/ML SOAJ Inject into the skin. 12/03/18   [provider]    Family History Family History  Problem Relation Age of Onset  . Leukemia Mother     Social History Social History   Tobacco Use  . Smoking status: Former Smoker    Packs/day: 1.00    Types: Cigarettes    Quit date: 01/07/2017    Years since quitting: 2.6  . Smokeless tobacco: Never Used  Substance Use Topics  . Alcohol use: No  . Drug use: No     Allergies   Patient  has no known allergies.   Review of Systems Review of Systems  Constitutional: Negative for chills and fever.  HENT: Positive for dental problem. Negative for facial swelling and sore throat.   Skin: Negative for color change and rash.     Physical Exam Triage Vital Signs ED Triage Vitals  Enc Vitals Group     BP 08/22/19 1843 127/85     Pulse Rate 08/22/19 1843 78     Resp 08/22/19 1843 18     Temp 08/22/19 1843 97.6 F (36.4 C)     Temp Source 08/22/19 1843 Oral     SpO2 08/22/19 1843 97 %     Weight 08/22/19 1844 166 lb (75.3 kg)     Height 08/22/19 1844 5' 10"  (1.778 m)     Head Circumference --      Peak Flow --      Pain Score 08/22/19 1844 8     Pain Loc --      Pain Edu? --      Excl. in [place]? --    No data found.  Updated Vital Signs BP 127/85 (BP Location: Right Arm)   Pulse 78   Temp 97.6 F (36.4  C) (Oral)   Resp 18   Ht 5' 10"  (1.778 m)   Wt 166 lb (75.3 kg)   SpO2 97%   BMI 23.82 kg/m   Visual Acuity Right Eye Distance:   Left Eye Distance:   Bilateral Distance:    Right Eye Near:   Left Eye Near:    Bilateral Near:     Physical Exam Vitals signs and nursing note reviewed.  Constitutional:      Appearance: Normal appearance. He is well-developed.  HENT:     Head: Normocephalic and atraumatic.     Jaw: There is normal jaw occlusion.      Right Ear: Tympanic membrane normal.     Left Ear: Tympanic membrane normal.     Nose:     Right Sinus: Maxillary sinus tenderness present. No frontal sinus tenderness.     Left Sinus: No maxillary sinus tenderness or frontal sinus tenderness.     Mouth/Throat:     Lips: Pink.     Mouth: Mucous membranes are moist.     Dentition: Abnormal dentition ( chipped teeth, multple dental caries ). Dental tenderness, dental caries and dental abscesses present.     Pharynx: Oropharynx is clear. Uvula midline.  Neck:     Musculoskeletal: Normal range of motion.  Cardiovascular:     Rate and Rhythm: Normal  rate.  Pulmonary:     Effort: Pulmonary effort is normal.  Musculoskeletal: Normal range of motion.  Skin:    General: Skin is warm and dry.  Neurological:     Mental Status: He is alert and oriented to person, place, and time.  Psychiatric:        Behavior: Behavior normal.      UC Treatments / Results  Labs (all labs ordered are listed, but only abnormal results are displayed) Labs Reviewed - No data to display  EKG   Radiology No results found.  Procedures Procedures (including critical care time)  Medications Ordered in UC Medications - No data to display  Initial Impression / Assessment and Plan / UC Course  I have reviewed the triage vital signs and the nursing notes.  Pertinent labs & imaging results that were available during my care of the patient were reviewed by me and considered in my medical decision making (see chart for details).    Will tx for dental abscess Given recent use of amoxicillin, will start pt on clindamycin today AVS provided  Final Clinical Impressions(s) / UC Diagnoses   Final diagnoses:  Dental abscess  Pain due to dental caries     Discharge Instructions      Please take antibiotics as prescribed and be sure to complete entire course even if you start to feel better to ensure infection does not come back.     ED Prescriptions    Medication Sig Dispense Auth. Provider   clindamycin (CLEOCIN) 300 MG capsule Take 1 capsule (300 mg total) by mouth 3 (three) times daily for 7 days. 28 capsule Noe Gens, [place]     I have reviewed the PDMP during this encounter.   Noe Gens, [place] 08/23/19 1115

## 2019-08-22 NOTE — ED Triage Notes (Signed)
Pt c/o dental abscess RT side top and bottom. Has a dental appt scheduled unsure of the date his wife scheduled it.

## 2019-09-04 ENCOUNTER — Other Ambulatory Visit: Payer: Self-pay

## 2019-09-04 ENCOUNTER — Emergency Department (INDEPENDENT_AMBULATORY_CARE_PROVIDER_SITE_OTHER)
Admission: EM | Admit: 2019-09-04 | Discharge: 2019-09-04 | Disposition: A | Discharge status: Home / Self Care | Payer: BC Managed Care – PPO | Source: Home / Self Care

## 2019-09-04 ENCOUNTER — Encounter: Payer: Self-pay | Admitting: *Deleted

## 2019-09-04 DIAGNOSIS — Z20822 Contact with and (suspected) exposure to covid-19: Secondary | ICD-10-CM

## 2019-09-04 DIAGNOSIS — Z20828 Contact with and (suspected) exposure to other viral communicable diseases: Secondary | ICD-10-CM | POA: Diagnosis not present

## 2019-09-04 NOTE — ED Triage Notes (Signed)
Pt reports wife COVID positive 3 wks ago. He would like test. Denies s/s.

## 2019-09-04 NOTE — Discharge Instructions (Signed)
° °  Due to concern for possibly having Covid-19, it is advised that you self-isolate at home until test results come back in 2-3 days  If positive, it is recommended you stay isolated for at least 10 days after symptom onset and 24 hours after last fever without taking medication (whichever is longer), with improving symptoms.  If you MUST go out, please wear a mask at all times, limit contact with others.

## 2019-09-04 NOTE — ED Provider Notes (Signed)
[name]    CSN: [id number] Arrival date & time: 09/04/19  1927      History   Chief Complaint Chief Complaint  Patient presents with  . coid exposure    HPI Kevin Carroll is a 38 y.o. male.   HPI Kevin Carroll is a 38 y.o. male presenting to UC with request for a Covid test prior to Thanksgiving next week. His wife tested positive 3 weeks ago. Pt has not had any symptoms but family insisted he be tested.  Denies HA, congestion, cough, fever, chills, n/v/d.     Past Medical History:  Diagnosis Date  . ADHD (attention deficit hyperactivity disorder)   . Anxiety   . 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 Depression   . UC (ulcerative colitis) ([place])     There are no active problems to display for this patient.   Past Surgical History:  Procedure Laterality Date  . BRAIN SURGERY         Home Medications    Prior to Admission medications   Medication Sig Start Date End Date Taking? Authorizing Provider  Golimumab (SIMPONI) 100 MG/ML SOAJ Inject into the skin. 12/03/18   [provider]    Family History Family History  Problem Relation Age of Onset  . Leukemia Mother     Social History Social History   Tobacco Use  . Smoking status: Former Smoker    Packs/day: 1.00    Types: Cigarettes    Quit date: 01/07/2017    Years since quitting: 2.6  . Smokeless tobacco: Never Used  Substance Use Topics  . Alcohol use: No  . Drug use: No     Allergies   Patient has no known allergies.   Review of Systems Review of Systems  Constitutional: Negative for chills and fever.  HENT: Negative for congestion, ear pain, sore throat, trouble swallowing and voice change.   Respiratory: Negative for cough and shortness of breath.   Cardiovascular: Negative for chest pain and palpitations.  Gastrointestinal: Negative for abdominal pain, diarrhea, nausea and vomiting.  Musculoskeletal: Negative for arthralgias, back pain and myalgias.   Skin: Negative for rash.     Physical Exam Triage Vital Signs ED Triage Vitals  Enc Vitals Group     BP 09/04/19 1934 120/81     Pulse Rate 09/04/19 1934 80     Resp 09/04/19 1934 16     Temp 09/04/19 1934 97.9 F (36.6 C)     Temp Source 09/04/19 1934 Oral     SpO2 09/04/19 1934 97 %     Weight 09/04/19 1935 170 lb (77.1 kg)     Height 09/04/19 1935 5' 10"  (1.778 m)     Head Circumference --      Peak Flow --      Pain Score 09/04/19 1935 0     Pain Loc --      Pain Edu? --      Excl. in [place]? --    No data found.  Updated Vital Signs BP 120/81 (BP Location: Right Arm)   Pulse 80   Temp 97.9 F (36.6 C) (Oral)   Resp 16   Ht 5' 10"  (1.778 m)   Wt 170 lb (77.1 kg)   SpO2 97%   BMI 24.39 kg/m   Visual Acuity Right Eye Distance:   Left Eye Distance:   Bilateral Distance:    Right Eye Near:   Left Eye Near:    Bilateral Near:  Physical Exam Vitals signs and nursing note reviewed.  Constitutional:      Appearance: Normal appearance. He is well-developed.  HENT:     Head: Normocephalic and atraumatic.     Right Ear: Tympanic membrane and ear canal normal.     Left Ear: Tympanic membrane and ear canal normal.     Nose: Nose normal.     Mouth/Throat:     Mouth: Mucous membranes are moist.     Pharynx: Oropharynx is clear.  Neck:     Musculoskeletal: Normal range of motion.  Cardiovascular:     Rate and Rhythm: Normal rate and regular rhythm.  Pulmonary:     Effort: Pulmonary effort is normal. No respiratory distress.     Breath sounds: Normal breath sounds.  Musculoskeletal: Normal range of motion.  Skin:    General: Skin is warm and dry.  Neurological:     Mental Status: He is alert and oriented to person, place, and time.  Psychiatric:        Behavior: Behavior normal.      UC Treatments / Results  Labs (all labs ordered are listed, but only abnormal results are displayed) Labs Reviewed  NOVEL CORONAVIRUS, NAA    EKG   Radiology No  results found.  Procedures Procedures (including critical care time)  Medications Ordered in UC Medications - No data to display  Initial Impression / Assessment and Plan / UC Course  I have reviewed the triage vital signs and the nursing notes.  Pertinent labs & imaging results that were available during my care of the patient were reviewed by me and considered in my medical decision making (see chart for details).     Normal exam Covid test pending AVS provided  Final Clinical Impressions(s) / UC Diagnoses   Final diagnoses:  Close exposure to COVID-19 virus     Discharge Instructions       Due to concern for possibly having Covid-19, it is advised that you self-isolate at home until test results come back in 2-3 days  If positive, it is recommended you stay isolated for at least 10 days after symptom onset and 24 hours after last fever without taking medication (whichever is longer), with improving symptoms.  If you MUST go out, please wear a mask at all times, limit contact with others.       ED Prescriptions    None     PDMP not reviewed this encounter.   Noe Gens, PA-C 09/05/19 1028

## 2019-09-07 LAB — NOVEL CORONAVIRUS, NAA: SARS-CoV-2, NAA: NOT DETECTED
